# Patient Record
Sex: Female | Born: 1995 | Race: White | Hispanic: No | Marital: Single | Smoking: Never smoker
Health system: Southern US, Community
[De-identification: ages and names within clinical notes are randomized; demographics above are authoritative.]

## PROBLEM LIST (undated history)

## (undated) DIAGNOSIS — I499 Cardiac arrhythmia, unspecified: Secondary | ICD-10-CM

## (undated) HISTORY — PX: SHOULDER SURGERY: SHX246

## (undated) HISTORY — PX: TONSILLECTOMY AND ADENOIDECTOMY: SHX28

## (undated) HISTORY — PX: OTHER SURGICAL HISTORY: SHX169

## (undated) HISTORY — PX: TONSILLECTOMY AND ADENOIDECTOMY: SUR1326

## (undated) HISTORY — PX: CARDIAC ELECTROPHYSIOLOGY MAPPING AND ABLATION: SHX1292

## (undated) HISTORY — DX: Cardiac arrhythmia, unspecified: I49.9

---
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
## 2013-01-10 ENCOUNTER — Ambulatory Visit: Payer: Self-pay | Admitting: Family Medicine

## 2013-01-16 ENCOUNTER — Ambulatory Visit: Payer: Self-pay | Admitting: Family Medicine

## 2013-01-31 ENCOUNTER — Ambulatory Visit: Payer: Self-pay | Admitting: Family Medicine

## 2013-02-17 ENCOUNTER — Ambulatory Visit: Payer: Self-pay | Admitting: Family Medicine

## 2013-12-29 ENCOUNTER — Ambulatory Visit: Payer: Self-pay | Admitting: Family Medicine

## 2013-12-30 ENCOUNTER — Ambulatory Visit: Payer: Self-pay | Admitting: Family Medicine

## 2014-01-05 ENCOUNTER — Ambulatory Visit: Payer: Self-pay | Admitting: Family Medicine

## 2014-06-03 ENCOUNTER — Ambulatory Visit: Payer: Self-pay | Admitting: Family Medicine

## 2014-06-24 ENCOUNTER — Ambulatory Visit: Payer: Self-pay | Admitting: Family Medicine

## 2014-12-31 DIAGNOSIS — K900 Celiac disease: Secondary | ICD-10-CM | POA: Diagnosis not present

## 2014-12-31 DIAGNOSIS — K580 Irritable bowel syndrome with diarrhea: Secondary | ICD-10-CM | POA: Diagnosis not present

## 2015-01-05 DIAGNOSIS — J069 Acute upper respiratory infection, unspecified: Secondary | ICD-10-CM | POA: Diagnosis not present

## 2015-01-07 ENCOUNTER — Ambulatory Visit
Admission: RE | Admit: 2015-01-07 | Discharge: 2015-01-07 | Disposition: A | Discharge status: Home / Self Care | Payer: No Typology Code available for payment source | Source: Ambulatory Visit | Attending: Family Medicine | Admitting: Family Medicine

## 2015-01-07 ENCOUNTER — Other Ambulatory Visit: Payer: Self-pay | Admitting: Family Medicine

## 2015-01-07 DIAGNOSIS — R059 Cough, unspecified: Secondary | ICD-10-CM

## 2015-01-07 DIAGNOSIS — J209 Acute bronchitis, unspecified: Secondary | ICD-10-CM | POA: Diagnosis not present

## 2015-01-07 DIAGNOSIS — R05 Cough: Secondary | ICD-10-CM | POA: Insufficient documentation

## 2015-01-22 DIAGNOSIS — J069 Acute upper respiratory infection, unspecified: Secondary | ICD-10-CM | POA: Diagnosis not present

## 2015-01-22 DIAGNOSIS — J019 Acute sinusitis, unspecified: Secondary | ICD-10-CM | POA: Diagnosis not present

## 2015-02-11 ENCOUNTER — Ambulatory Visit: Payer: Self-pay | Admitting: Gastroenterology

## 2015-02-11 ENCOUNTER — Other Ambulatory Visit: Payer: Self-pay

## 2015-03-15 ENCOUNTER — Ambulatory Visit (INDEPENDENT_AMBULATORY_CARE_PROVIDER_SITE_OTHER): Payer: No Typology Code available for payment source | Admitting: Gastroenterology

## 2015-03-15 ENCOUNTER — Encounter: Payer: Self-pay | Admitting: Gastroenterology

## 2015-03-15 ENCOUNTER — Encounter (INDEPENDENT_AMBULATORY_CARE_PROVIDER_SITE_OTHER): Payer: Self-pay

## 2015-03-15 VITALS — BP 119/77 | HR 102 | Temp 98.3°F | Ht 65.0 in | Wt 133.0 lb

## 2015-03-15 DIAGNOSIS — K589 Irritable bowel syndrome without diarrhea: Secondary | ICD-10-CM | POA: Diagnosis not present

## 2015-03-15 NOTE — Progress Notes (Signed)
Gastroenterology Consultation  Referring Provider:     No ref. provider found Primary Care Physician:  PROVIDER NOT IN SYSTEM Primary Gastroenterologist:  Dr. Servando Snare     Reason for Consultation:     IBS        HPI:   Joanna Webb is a 19 y.o. y/o female referred for consultation & management of IBS by Dr. PROVIDER NOT IN SYSTEM.  This patient comes today for symptoms of diarrhea and IBS. The patient reports that she was seen by her gastrologist who is near her home a few months ago with an upper endoscopy. The patient is now a Consulting civil engineer at [employer]. She states that she was having significant diarrhea and the diarrhea was waking her up at night. There is no report of any rectal bleeding with the diarrhea. The patient states that when she started watching her diet including avoiding wheat products her diarrhea had stopped. She denies any cramps or abdominal pain at the present time. The patient had a TTG that was slightly high at 4 but had an upper endoscopy with small bowel biopsies that were normal. There was a report of a 20 pound weight loss last summer when she was having most of her symptoms but she states she has gained 5 of those pounds back. She now states that she is at her baseline without any other GI symptoms at the present time.  Past Medical History  Diagnosis Date  . Arrhythmia     Past Surgical History  Procedure Laterality Date  . Shoulder surgery Right   . Cardiac electrophysiology mapping and ablation    . Tonsillectomy and adenoidectomy      Prior to Admission medications   Medication Sig Start Date End Date Taking? Authorizing Provider  metoprolol succinate (TOPROL-XL) 25 MG 24 hr tablet  02/01/15  Yes Historical Provider, MD  dicyclomine (BENTYL) 10 MG capsule  11/25/14   Historical Provider, MD  fluticasone (FLONASE) 50 MCG/ACT nasal spray USE 2 SPRAY IN EACH NOSTRIL ONCE DAILY AS NEEDED FOR NASAL CONGESTION 01/22/15   Historical Provider, MD    Family History    Problem Relation Age of Onset  . Hypothyroidism Mother   . Irritable bowel syndrome Maternal Grandmother      Social History  Substance Use Topics  . Smoking status: Never Smoker   . Smokeless tobacco: Never Used  . Alcohol Use: No    Allergies as of 03/15/2015 - Review Complete 03/15/2015  Allergen Reaction Noted  . Shellfish allergy Anaphylaxis 03/15/2015  . Zyrtec [cetirizine]  03/15/2015    Review of Systems:    All systems reviewed and negative except where noted in HPI.   Physical Exam:  BP 119/77 mmHg  Pulse 102  Temp(Src) 98.3 F (36.8 C) (Oral)  Ht  (1.651 m)  Wt 133 lb (60.328 kg)  BMI 22.13 kg/m2 No LMP recorded. Psych:  Alert and cooperative. Normal mood and affect. General:   Alert,  Well-developed, well-nourished, pleasant and cooperative in NAD Head:  Normocephalic and atraumatic. Eyes:  Sclera clear, no icterus.   Conjunctiva pink. Ears:  Normal auditory acuity. Nose:  No deformity, discharge, or lesions. Mouth:  No deformity or lesions,oropharynx pink & moist. Neck:  Supple; no masses or thyromegaly. Lungs:  Respirations even and unlabored.  Clear throughout to auscultation.   No wheezes, crackles, or rhonchi. No acute distress. Heart:  Regular rate and rhythm; no murmurs, clicks, rubs, or gallops. Abdomen:  Normal bowel sounds.  No  bruits.  Soft, non-tender and non-distended without masses, hepatosplenomegaly or hernias noted.  No guarding or rebound tenderness.  Negative Carnett sign.   Rectal:  Deferred.  Msk:  Symmetrical without gross deformities.  Good, equal movement & strength bilaterally. Pulses:  Normal pulses noted. Extremities:  No clubbing or edema.  No cyanosis. Neurologic:  Alert and oriented x3;  grossly normal neurologically. Skin:  Intact without significant lesions or rashes.  No jaundice. Lymph Nodes:  No significant cervical adenopathy. Psych:  Alert and cooperative. Normal mood and affect.  Imaging Studies: No results  found.  Assessment and Plan:   Joanna Webb is a 19 y.o. y/o female who has a history of diarrhea predominant irritable bowel syndrome who is now well controlled with diet. The patient is not having any symptoms at the present time and has been told to contact me if any of her symptoms come back. The patient does feel better on a wheat free diet although her biopsies of her small bowel were not consistent with celiac sprue. The patient has been explained the plan and she states she agrees with it and will contact me if she has any further problems.   Note: This dictation was prepared with Dragon dictation along with smaller phrase technology. Any transcriptional errors that result from this process are unintentional.

## 2015-03-18 ENCOUNTER — Other Ambulatory Visit: Payer: Self-pay | Admitting: Family Medicine

## 2015-03-18 ENCOUNTER — Ambulatory Visit
Admission: RE | Admit: 2015-03-18 | Discharge: 2015-03-18 | Disposition: A | Discharge status: Home / Self Care | Payer: No Typology Code available for payment source | Source: Ambulatory Visit | Attending: Family Medicine | Admitting: Family Medicine

## 2015-03-18 DIAGNOSIS — M79671 Pain in right foot: Secondary | ICD-10-CM | POA: Insufficient documentation

## 2015-03-18 DIAGNOSIS — R52 Pain, unspecified: Secondary | ICD-10-CM

## 2015-03-18 DIAGNOSIS — S92901A Unspecified fracture of right foot, initial encounter for closed fracture: Secondary | ICD-10-CM | POA: Diagnosis not present

## 2015-05-06 DIAGNOSIS — J020 Streptococcal pharyngitis: Secondary | ICD-10-CM

## 2015-06-15 ENCOUNTER — Encounter: Payer: Self-pay | Admitting: Family Medicine

## 2015-06-15 ENCOUNTER — Ambulatory Visit
Admission: RE | Admit: 2015-06-15 | Discharge: 2015-06-15 | Disposition: A | Discharge status: Home / Self Care | Payer: BLUE CROSS/BLUE SHIELD | Source: Ambulatory Visit | Attending: Family Medicine | Admitting: Family Medicine

## 2015-06-15 ENCOUNTER — Ambulatory Visit (INDEPENDENT_AMBULATORY_CARE_PROVIDER_SITE_OTHER): Payer: BLUE CROSS/BLUE SHIELD | Admitting: Family Medicine

## 2015-06-15 ENCOUNTER — Other Ambulatory Visit: Payer: Self-pay | Admitting: Family Medicine

## 2015-06-15 ENCOUNTER — Other Ambulatory Visit: Payer: Self-pay

## 2015-06-15 DIAGNOSIS — M79672 Pain in left foot: Secondary | ICD-10-CM | POA: Insufficient documentation

## 2015-06-15 DIAGNOSIS — R52 Pain, unspecified: Secondary | ICD-10-CM

## 2015-06-15 NOTE — Progress Notes (Signed)
Patient ID: Deveron Furlong Smaltz, female   DOB: 1995-09-30, 20 y.o.   MRN: [id number]  Patient presents with symptoms of left foot pain for about a week that hasn't improved. It may have started hurting after she tried to dodge a pole but patient not sure. Pain is on the dorsum of the foot and it is uncomfortable to walk. Denies any significant swelling or bruising. Denies any changes in shoewear recently.   ROS: Negative except mentioned above.  Vitals as per Epic.  GENERAL: NAD RESP: CTA B CARD: RRR MSK: Left Foot- no deformity, no ecchymosis, mild/moderate tenderness and minimal swelling along anterior proximal aspect of foot (around medial cuneiform), ? small ganglion appreciated in this area that was tender, FROM, mild discomfort with inversion, no tenderness along base of 5th metatarsal, nv intact  NEURO: CN II-XII grossly intact   A/P: L Foot Pain- could be related to foot sprain, ganglion, less likely stress injury, will do xrays, consider doing [place] on Thurs. in the office to see if there is a ganglion, Naprosyn prn, no activity that causes pain for now, consider further imaging with MRI if pain persists/worsens.

## 2015-06-17 ENCOUNTER — Ambulatory Visit (INDEPENDENT_AMBULATORY_CARE_PROVIDER_SITE_OTHER): Payer: BLUE CROSS/BLUE SHIELD | Admitting: Family Medicine

## 2015-06-17 ENCOUNTER — Encounter: Payer: Self-pay | Admitting: Family Medicine

## 2015-06-17 DIAGNOSIS — M25572 Pain in left ankle and joints of left foot: Secondary | ICD-10-CM | POA: Diagnosis not present

## 2015-06-17 NOTE — Addendum Note (Signed)
Addended by: Dione Housekeeper on: 06/17/2015 12:44 PM   Modules accepted: Orders

## 2015-06-17 NOTE — Progress Notes (Signed)
Patient ID: Joanna Webb, female   DOB: 1995/08/10, 20 y.o.   MRN: [id number]  Patient presents today to look at left foot with [place] to assess for ?ganglion cyst. Patient still with proximal dorsal midfoot pain. Was able to practice yesterday but had some pain.   Imaging- area of interest was located by palpation and with inversion, [place] was used to assess for any cyst/mass, not certain if there is one by imaging.  A/P:  Left Foot Pain- xrays normal, suggest MRI to further evaluate.

## 2015-06-29 ENCOUNTER — Ambulatory Visit: Payer: BLUE CROSS/BLUE SHIELD

## 2016-01-04 ENCOUNTER — Ambulatory Visit (INDEPENDENT_AMBULATORY_CARE_PROVIDER_SITE_OTHER): Payer: BLUE CROSS/BLUE SHIELD | Admitting: Family Medicine

## 2016-01-04 DIAGNOSIS — M25511 Pain in right shoulder: Secondary | ICD-10-CM

## 2016-01-04 MED ORDER — NAPROXEN 500 MG PO TABS: 500.0000 mg | ORAL_TABLET | Freq: Two times a day (BID) | ORAL | 0 refills | Status: AC

## 2016-01-05 ENCOUNTER — Ambulatory Visit
Admission: RE | Admit: 2016-01-05 | Discharge: 2016-01-05 | Disposition: A | Discharge status: Home / Self Care | Payer: BLUE CROSS/BLUE SHIELD | Source: Ambulatory Visit | Attending: Family Medicine | Admitting: Family Medicine

## 2016-01-05 DIAGNOSIS — M25511 Pain in right shoulder: Secondary | ICD-10-CM | POA: Insufficient documentation

## 2016-01-06 ENCOUNTER — Ambulatory Visit (INDEPENDENT_AMBULATORY_CARE_PROVIDER_SITE_OTHER): Payer: BLUE CROSS/BLUE SHIELD | Admitting: Family Medicine

## 2016-01-06 ENCOUNTER — Other Ambulatory Visit: Payer: Self-pay | Admitting: Family Medicine

## 2016-01-06 DIAGNOSIS — M25511 Pain in right shoulder: Secondary | ICD-10-CM

## 2016-01-06 NOTE — Progress Notes (Signed)
Patient with no significant improvement in symptoms from last visit. Still is having clicking and popping of the shoulder. Has history of labral repair in high school to right shoulder. Patient is having anterior shoulder pain and posterior discomfort. She has not been playing tennis. She has had some difficulty when sleeping due to the discomfort. She has been taking Aleve and Tylenol and has not had time to go get the naproxen that was prescribed for her. X-rays were reviewed.  *Refer to previous exam, similar today.   A/P: Right shoulder pain with history of labral repair a few years ago -given patient's symptoms and exam  I would recommend that we do further imaging with MRI, also would like the patient to see Dr. Ardine Engiehl on Monday. Patient can do [employer] and work on range of motion. Naprosyn when necessary. Will discuss with trainer.

## 2016-01-07 ENCOUNTER — Ambulatory Visit
Admission: RE | Admit: 2016-01-07 | Discharge: 2016-01-07 | Disposition: A | Discharge status: Home / Self Care | Payer: BLUE CROSS/BLUE SHIELD | Source: Ambulatory Visit | Attending: Family Medicine | Admitting: Family Medicine

## 2016-01-07 DIAGNOSIS — M25511 Pain in right shoulder: Secondary | ICD-10-CM | POA: Diagnosis not present

## 2016-01-14 NOTE — Progress Notes (Signed)
Patient presents today with symptoms of right shoulder pain. Patient states that she recalls when she started to have the pain with playing tennis this season. She denies any other trauma or injury to the shoulder. She states that she has had right labral surgery in high school before coming to [place]. She states that she feels popping of the shoulder at times. Overhead movements are difficult.  ROS: Negative except mentioned above.  Vitals as per Epic GENERAL: NAD RESP: CTA B CARD: RRR MSK: R Shoulder - no obvious deformity, there is mild tenderness anteriorly and posteriorly, range of motion limited to forward flexion to about 120, positive apprehension, positive impingement, negative lift off, negative empty can, positive O'Brien's, and NV intact NEURO: CN II-XII grossly intact   A/P: Right shoulder pain - given patient's symptoms, history and exam recommend doing some imaging with x-ray and MRI if no improvement in the next few days, NSAIDs when necessary, ice prn, work on range of motion with trainer, would like to get postop notes related to surgery patient had in high school. Follow up with Dr. Ardine Engiehl if needed.

## 2016-02-24 ENCOUNTER — Ambulatory Visit (INDEPENDENT_AMBULATORY_CARE_PROVIDER_SITE_OTHER): Payer: BLUE CROSS/BLUE SHIELD | Admitting: Family Medicine

## 2016-02-24 ENCOUNTER — Encounter: Payer: Self-pay | Admitting: Family Medicine

## 2016-02-24 VITALS — BP 124/78 | HR 91 | Temp 98.9°F

## 2016-02-24 DIAGNOSIS — J028 Acute pharyngitis due to other specified organisms: Secondary | ICD-10-CM

## 2016-02-24 LAB — POCT RAPID STREP A (OFFICE): RAPID STREP A SCREEN: NEGATIVE

## 2016-02-24 MED ORDER — AMOXICILLIN 500 MG PO CAPS: 500.0000 mg | ORAL_CAPSULE | Freq: Two times a day (BID) | ORAL | 0 refills | Status: AC

## 2016-02-24 NOTE — Progress Notes (Signed)
Presents today with symptoms of sore throat, headache, low-grade fever. Patient states that her symptoms started yesterday. She denies any chest pain, shortness of breath, nausea, vomiting, abdominal pain, nasal congestion. She does believe she had some night sweats last night. She denies any history of mono in the past.  ROS: Negative except mentioned above. Vitals as per Epic.  GENERAL: NAD HEENT: mild pharyngeal erythema, no exudate, no erythema of TMs, mild cervical LAD RESP: CTA B CARD: RRR NEURO: CN II-XII grossly intact   A/P: Pharyngitis-rapid strep test negative, throat culture sent, will treat with Amoxicillin, rest, hydration, Tylenol/ibuprofen when necessary, seek medical attention if symptoms persist or worsen as discussed. No athletic activity afebrile.

## 2016-02-27 LAB — CULTURE, GROUP A STREP: Strep A Culture: NEGATIVE

## 2016-02-28 ENCOUNTER — Ambulatory Visit (INDEPENDENT_AMBULATORY_CARE_PROVIDER_SITE_OTHER): Payer: BLUE CROSS/BLUE SHIELD | Admitting: Family Medicine

## 2016-02-28 ENCOUNTER — Encounter: Payer: Self-pay | Admitting: Family Medicine

## 2016-02-28 VITALS — BP 129/74 | HR 65 | Temp 98.8°F

## 2016-02-28 DIAGNOSIS — R509 Fever, unspecified: Secondary | ICD-10-CM

## 2016-02-28 DIAGNOSIS — J029 Acute pharyngitis, unspecified: Secondary | ICD-10-CM

## 2016-02-28 NOTE — Progress Notes (Signed)
Patient presents today with symptoms of nasal drainage, pharyngitis. Patient was here last week with symptoms of pharyngitis and fever. Patient states that she still has a low-grade fever. She started amoxicillin on Thursday last week. She denies any abdominal pain, nausea, vomiting, chest pain, shortness of breath. She does complain of some headache. Patient usually takes Allegra for her allergy symptoms but has not been taking it recently.  ROS: Negative except mentioned above. Vitals as per Epic.  GENERAL: NAD HEENT: mild pharyngeal erythema, no exudate, no erythema of TMs, mild right cervical LAD RESP: CTA B CARD: RRR ABD: +BS, NT, no organomegly appreciated  NEURO: CN II-XII grossly intact   A/P: Postnasal drip, fever, pharyngitis  - given fever likely viral in etiology since patient is taking amoxicillin, will check CBC and mono spot test, patient will not do any athletic activity until results are back. I've recommended that she take her Allegra and/or Flonase. She is to continue Ibuprofen/Tylenol as well. If any worsening symptoms she will seek medical attention as discussed.

## 2016-02-29 LAB — CBC WITH DIFFERENTIAL/PLATELET
BASOS ABS: 0.1 10*3/uL (ref 0.0–0.2)
Basos: 1 %
EOS (ABSOLUTE): 0.1 10*3/uL (ref 0.0–0.4)
Eos: 1 %
HEMOGLOBIN: 13.2 g/dL (ref 11.1–15.9)
Hematocrit: 39.9 % (ref 34.0–46.6)
IMMATURE GRANS (ABS): 0.1 10*3/uL (ref 0.0–0.1)
IMMATURE GRANULOCYTES: 1 %
LYMPHS: 25 %
Lymphocytes Absolute: 2 10*3/uL (ref 0.7–3.1)
MCH: 28.6 pg (ref 26.6–33.0)
MCHC: 33.1 g/dL (ref 31.5–35.7)
MCV: 87 fL (ref 79–97)
MONOCYTES: 9 %
Monocytes Absolute: 0.7 10*3/uL (ref 0.1–0.9)
NEUTROS ABS: 5 10*3/uL (ref 1.4–7.0)
NEUTROS PCT: 63 %
PLATELETS: 336 10*3/uL (ref 150–379)
RBC: 4.61 x10E6/uL (ref 3.77–5.28)
RDW: 13.3 % (ref 12.3–15.4)
WBC: 7.9 10*3/uL (ref 3.4–10.8)

## 2016-02-29 LAB — MONONUCLEOSIS SCREEN: MONO SCREEN: NEGATIVE

## 2016-04-21 ENCOUNTER — Encounter: Payer: Self-pay | Admitting: Family Medicine

## 2016-04-21 ENCOUNTER — Ambulatory Visit (INDEPENDENT_AMBULATORY_CARE_PROVIDER_SITE_OTHER): Payer: BLUE CROSS/BLUE SHIELD | Admitting: Family Medicine

## 2016-04-21 VITALS — BP 129/79 | HR 74 | Temp 98.3°F | Resp 14

## 2016-04-21 DIAGNOSIS — B2790 Infectious mononucleosis, unspecified without complication: Secondary | ICD-10-CM

## 2016-04-21 NOTE — Progress Notes (Signed)
Patient presents today for follow-up regarding diagnosis of mono over Christmas break. Patient was diagnosed on 03/28/2016 at an urgent care. She states that she went to the ENT on 03/27/2016 because she had enlarged cervical lymph nodes. She states that she had a sore throat and was fatigued and had slightly decreased appetite. She denies having any abdominal pain. She states that she started to feel better within a week. She now only has a little fatigue and headache. She is unsure whether the fatigue is related to coming back to school and getting back in a routine. She states that the headache is only minimal. She has had no nausea or vomiting or any urinary or abdominal symptoms. She denies any chest pain or shortness of breath. She states her sleep and appetite are back to normal. She has done some elliptical work and bike work this week without any problems. She has has not done any tennis activities.  ROS: Negative except mentioned above. Vitals as per Epic.  GENERAL: NAD HEENT: no pharyngeal erythema, no exudate, no erythema of TMs, no cervical LAD RESP: CTA B CARD: RRR ABD: Positive bowel sounds, nontender, no organomegaly appreciated NEURO: CN II-XII grossly intact   A/P: Infectious mono. - Has been over 3 weeks since diagnosis, patient appears to be doing well, I have encouraged her to make sure she is hydrated at all times, she is to advance her activity slowly over the course of the week. Her trainer will inform me on how she is doing. If she has any further problems regarding fatigue or headache she will follow up with me.

## 2016-04-28 ENCOUNTER — Encounter: Payer: Self-pay | Admitting: Family Medicine

## 2016-04-28 ENCOUNTER — Ambulatory Visit (INDEPENDENT_AMBULATORY_CARE_PROVIDER_SITE_OTHER): Payer: BLUE CROSS/BLUE SHIELD | Admitting: Family Medicine

## 2016-04-28 DIAGNOSIS — B2790 Infectious mononucleosis, unspecified without complication: Secondary | ICD-10-CM

## 2016-04-28 NOTE — Progress Notes (Signed)
Patient presents today feeling well. Patient states that she has been progressing back to tenderness activity after having infectious mono. She has had no problems with her progression. She states that her sleep and appetite are back to normal. She has had some right shoulder soreness which is mostly anterior with practice a few days ago. She states that the soreness resolved within 2 days. Patient has a history of a labral tear in high school which was repaired. She did have some right shoulder pain this fall that caused her to miss some of the tennis season. She saw her orthopedic surgeon and did have an MRI at that time. She was told to rest and to advance activity when her pain resolved. Patient has had improvement with anti-inflammatory medication that she has taken for the pain in the past. She is currently not on taking the medication.  ROS: Negative except mentioned above. Vitals as per Epic GENERAL: NAD RESP: CTA B CARD: RRR MSK: R Shoulder - minimal anterior tenderness at biceps insertion, full range of motion, negative lift off, negative impingement testing, negative O'Brien's, negative empty can, negative cross arm, good strength, NV intact NEURO: CN II-XII grossly intact   A/P: Right shoulder pain - advance slowly and modify activity, discussed with patient and trainer, Naprosyn when necessary with food, if patient has consistent/persistent pain she states that she will follow-up with me or her orthopedic surgeon in [place].  Infectious mono. - resolved, patient has no complaints and can progress activity as tolerated.

## 2016-06-12 ENCOUNTER — Ambulatory Visit (INDEPENDENT_AMBULATORY_CARE_PROVIDER_SITE_OTHER): Payer: BLUE CROSS/BLUE SHIELD | Admitting: Family Medicine

## 2016-06-12 ENCOUNTER — Encounter: Payer: Self-pay | Admitting: Family Medicine

## 2016-06-12 VITALS — BP 123/67 | HR 53 | Temp 98.4°F | Resp 14

## 2016-06-12 DIAGNOSIS — K529 Noninfective gastroenteritis and colitis, unspecified: Secondary | ICD-10-CM

## 2016-06-12 NOTE — Progress Notes (Signed)
Patient presents today with symptoms of vomiting and diarrhea. Patient states that her symptoms started last night around 11 PM. She states she ate at the [employer] and had gluten-free cheesecake and gluten-free pasta. She experienced severe abdominal cramping. She denies any fever or chills. She denies any bloody diarrhea. She is not taking any medications for her symptoms. She was able to eat oatmeal this morning without any problems and drink fluids. She states that her abdominal pain has decreased and only feels an ache now. She denies any alcohol use yesterday. She denies any nausea at this time.  ROS: Negative except mentioned above. Vitals as per Epic.  GENERAL: NAD HEENT: No pharyngeal erythema, no exudate, no erythema of TMs RESP: CTA B CARD: RRR ABD: Positive bowel sounds, nontender, no rebound or guarding appreciated NEURO: CN II-XII grossly intact   A/P: Gastroenteritis - rest, fluids, BRAT diet, advance diet slowly, no athletic activity and filled diarrhea and vomiting have resolved, seek medical attention if symptoms persist or worsen as discussed.

## 2017-11-12 IMAGING — CR DG FOOT COMPLETE 3+V*R*
1 series · 3 of 3 positions shown · non-contrast
Comparison: None.

CLINICAL DATA: Hx right foot pain mostly @ plantar portion of great
toe/no known injury/Bettie Klimek/pain x 1 month/no previous
fx/no surgery

EXAM:
RIGHT FOOT COMPLETE - 3+ VIEW

[Series 1: dg foot complete right · 0.14mm/px · 3 of 3 slices shown]
[im 1/3]
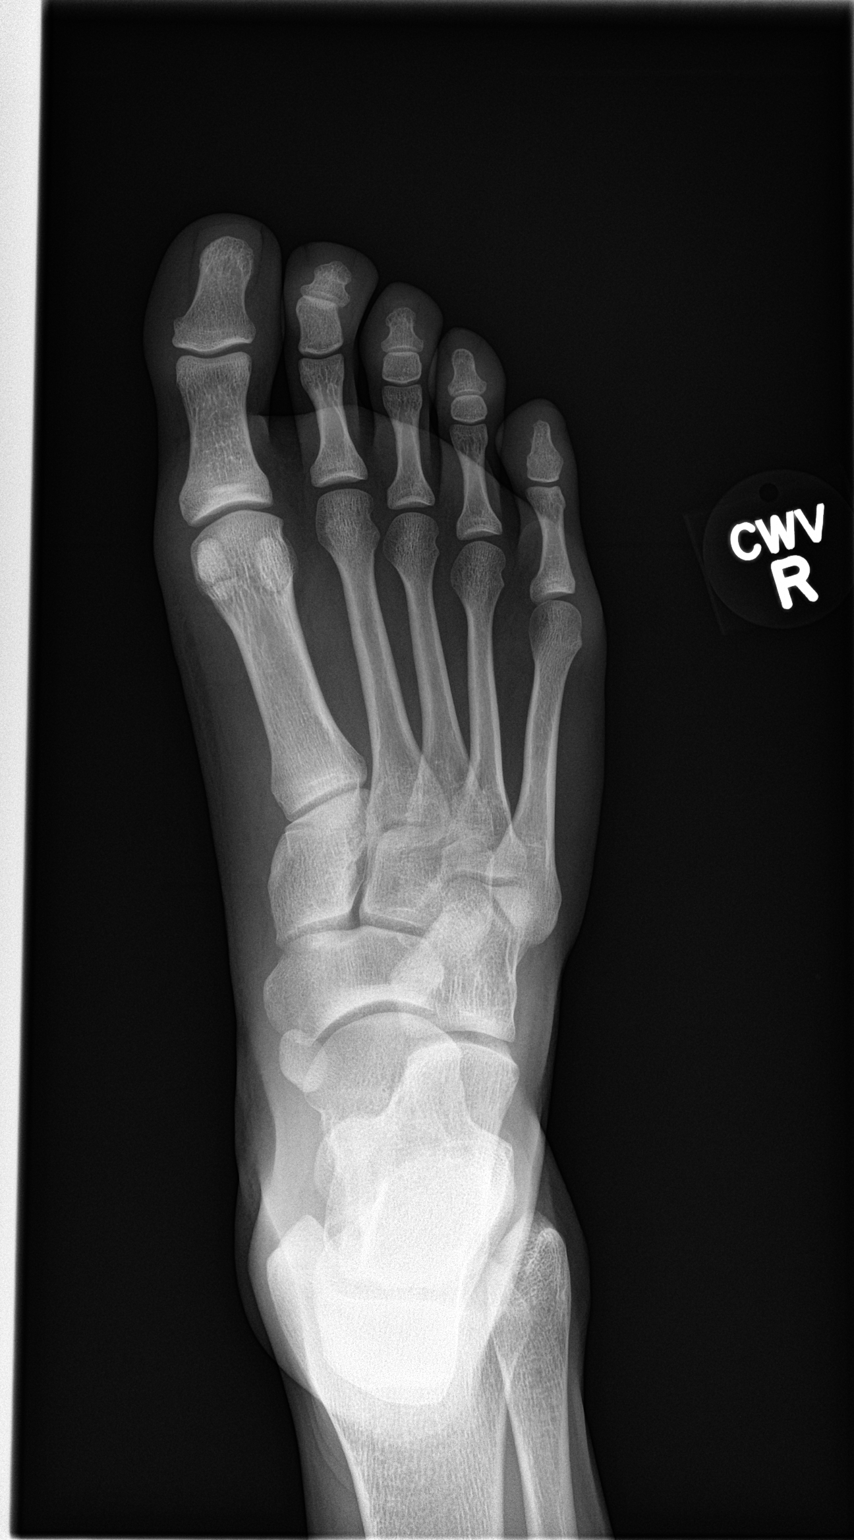
[im 2/3]
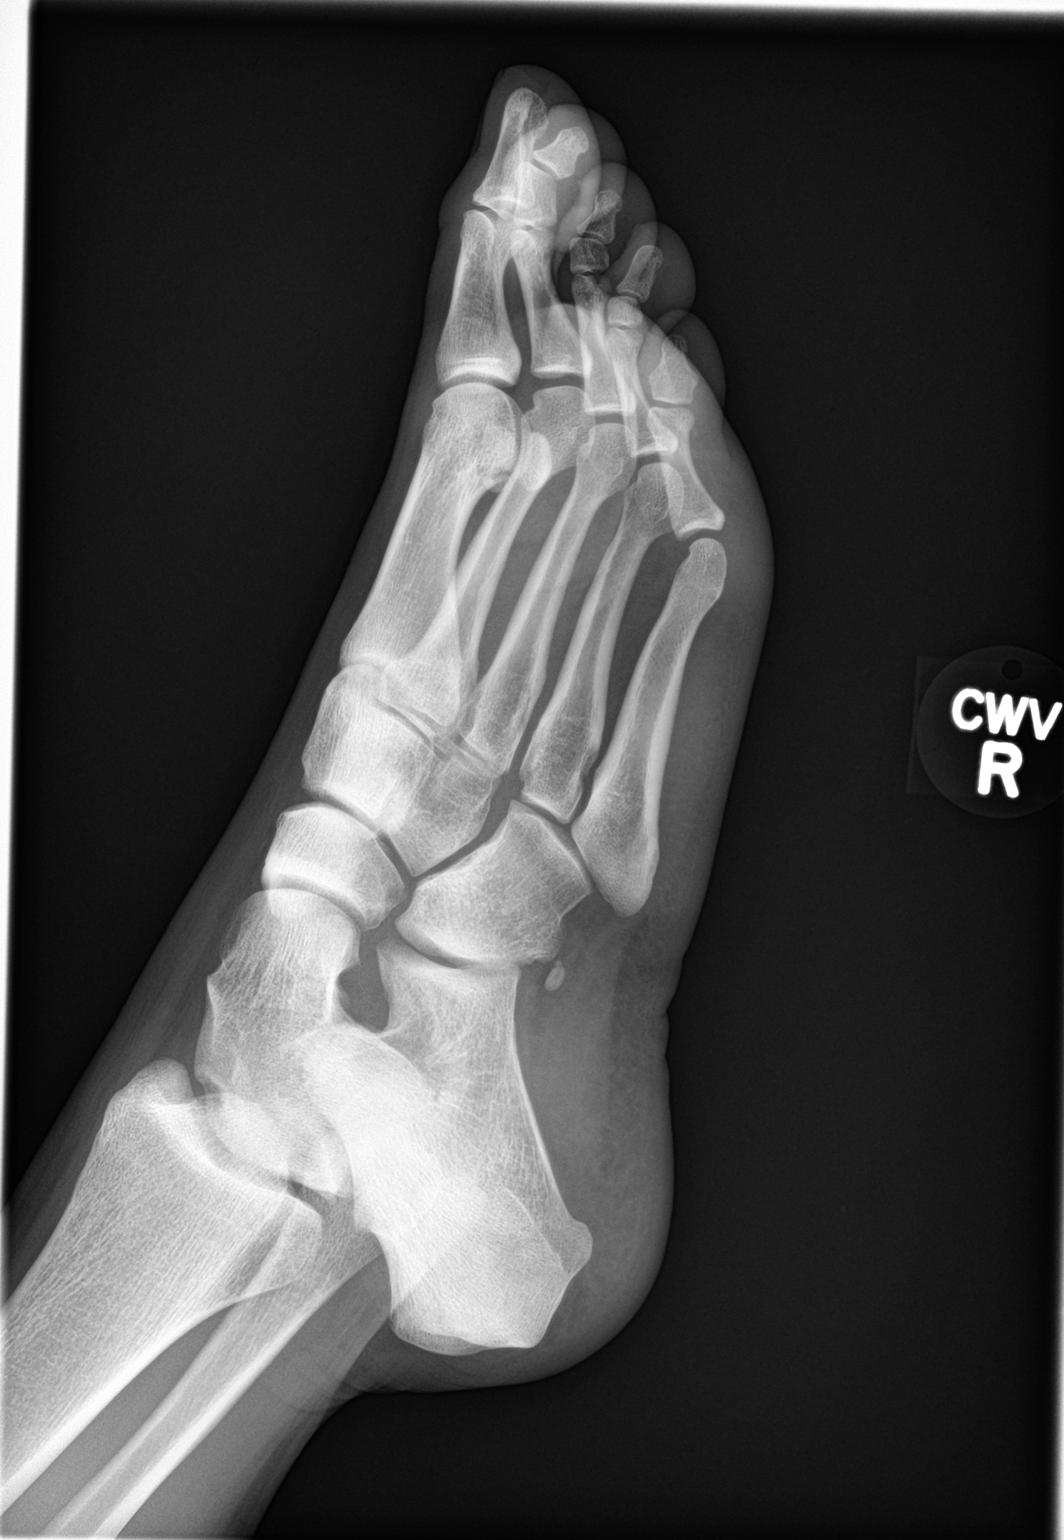
[im 3/3]
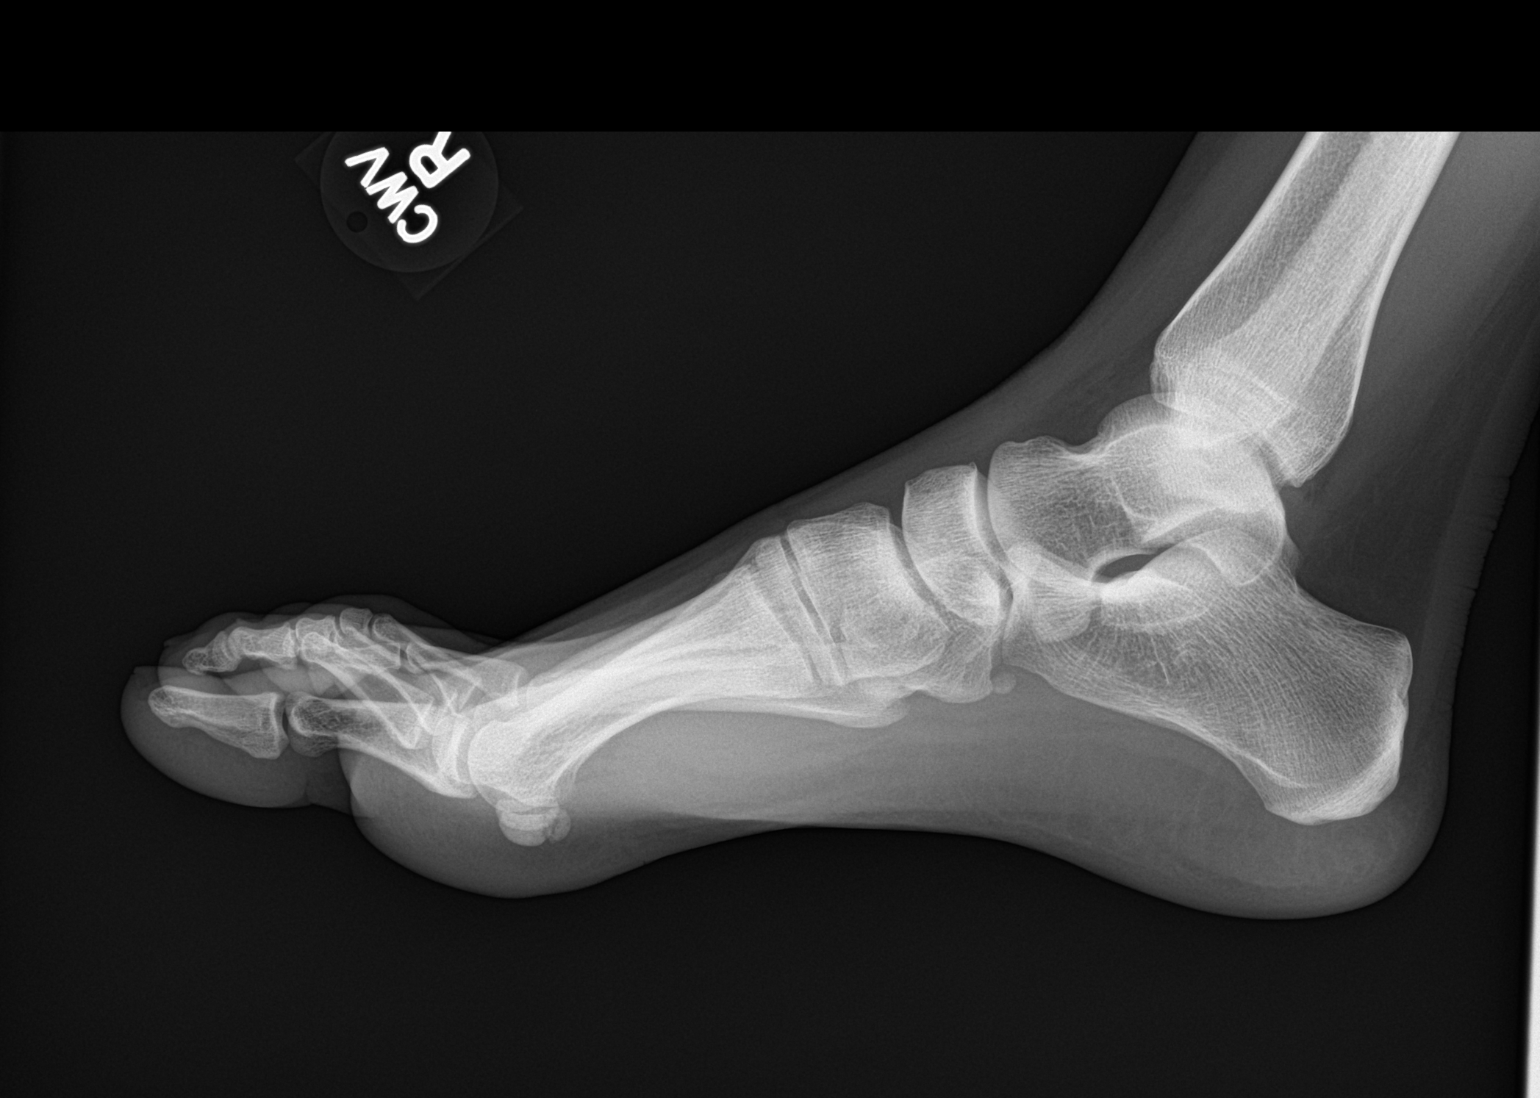

[3 of 3 positions shown; findings below may reference images not displayed]

FINDINGS: There is no evidence of fracture or dislocation. There is no
evidence of arthropathy or other focal bone abnormality. Soft
tissues are unremarkable.
IMPRESSION: Negative.
